# Patient Record
Sex: Male | Born: 2000 | Race: White | Hispanic: No | Marital: Single | Smoking: Former smoker
Health system: Southern US, Community
[De-identification: ages and names within clinical notes are randomized; demographics above are authoritative.]

## PROBLEM LIST (undated history)

## (undated) DIAGNOSIS — Z789 Other specified health status: Secondary | ICD-10-CM

## (undated) HISTORY — PX: SHOULDER SURGERY: SHX246

## (undated) HISTORY — DX: Other specified health status: Z78.9

---
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
## 2019-01-19 DIAGNOSIS — M25611 Stiffness of right shoulder, not elsewhere classified: Secondary | ICD-10-CM | POA: Insufficient documentation

## 2019-03-14 IMAGING — XA DG FLUORO GUIDE NDL PLC/BX
1 series · 1 of 1 positions shown · IV contrast (multihance)
Comparison: none

CLINICAL DATA: Chronic right shoulder pain.

EXAM:
RIGHT SHOULDER INJECTION UNDER FLUOROSCOPY
TECHNIQUE: An appropriate skin entrance site was determined. The site was
marked, prepped with Betadine, draped in the usual sterile fashion,
and infiltrated locally with buffered Lidocaine. A 22 gauge spinal
needle was advanced to the superomedial margin of the humeral head
under intermittent fluoroscopy. 1 mL of 1% lidocaine injected
easily. A mixture of 0.05 mL of MultiHance, 5 mL of 1% lidocaine,
and 15 mL of Isovue-M 200 was then used to opacify the right
shoulder capsule. 12 mL of this mixture were injected. No immediate
complication.
FLUOROSCOPY TIME:  Fluoroscopy Time:  3 seconds
Radiation Exposure Index (if provided by the fluoroscopic device):
14.31 microGray*m^2
Number of Acquired Spot Images: 0

[Series 1: ortho standard · 1 of 1 slices shown]
[im 1/1]
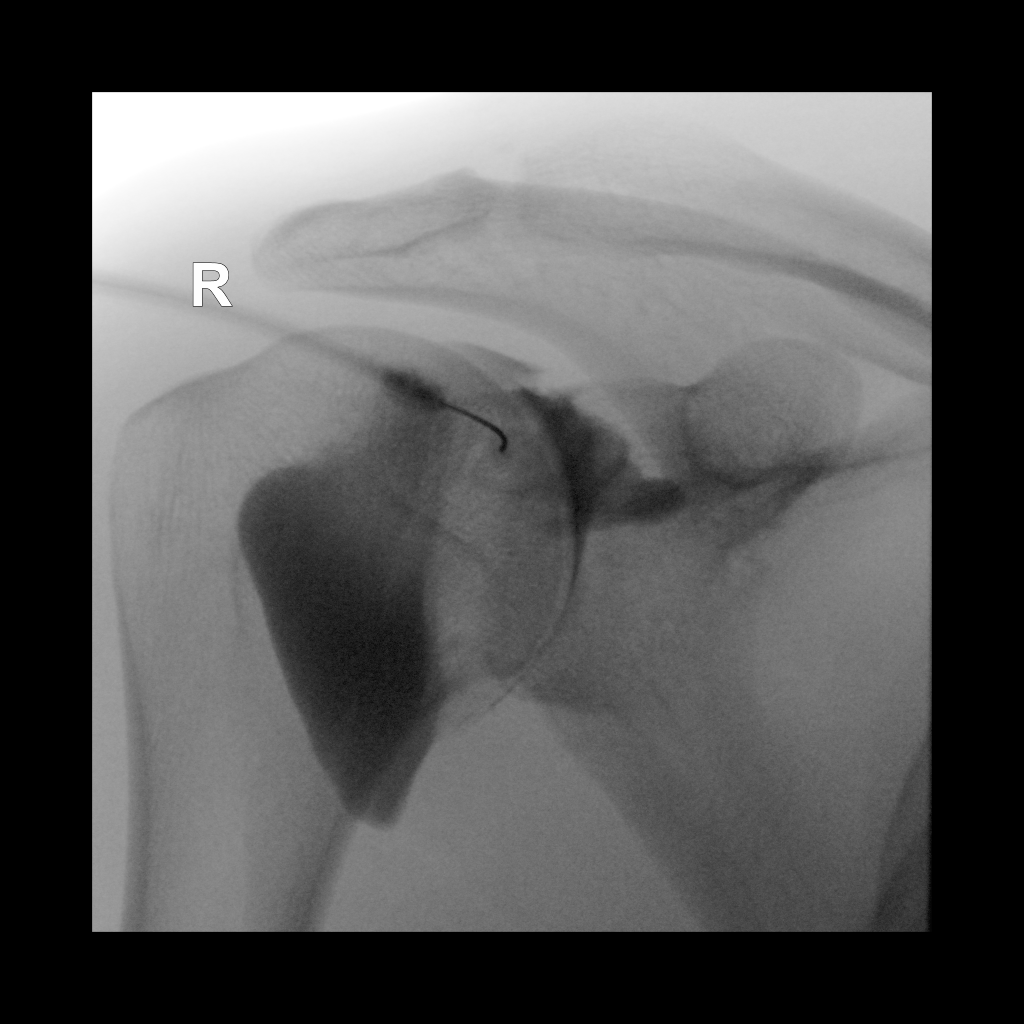

[1 of 1 positions shown; findings below may reference images not displayed]

IMPRESSION: Technically successful right shoulder injection for MRI.

## 2020-04-26 IMAGING — CR RIGHT ELBOW - COMPLETE 3+ VIEW
4 series · 4 of 4 positions shown · non-contrast
Comparison: None.

CLINICAL DATA: Fall from truck bed with elbow pain, initial
encounter

EXAM:
RIGHT ELBOW - COMPLETE 3+ VIEW

[elbow ap]
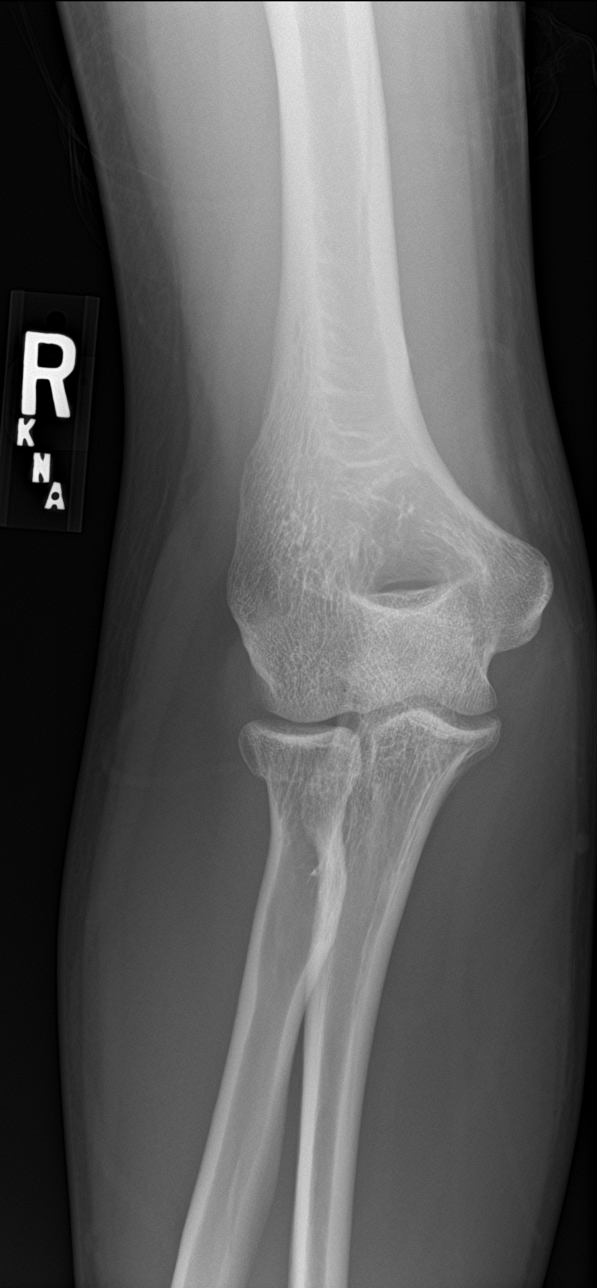

[elbow obl (1 of 2)]
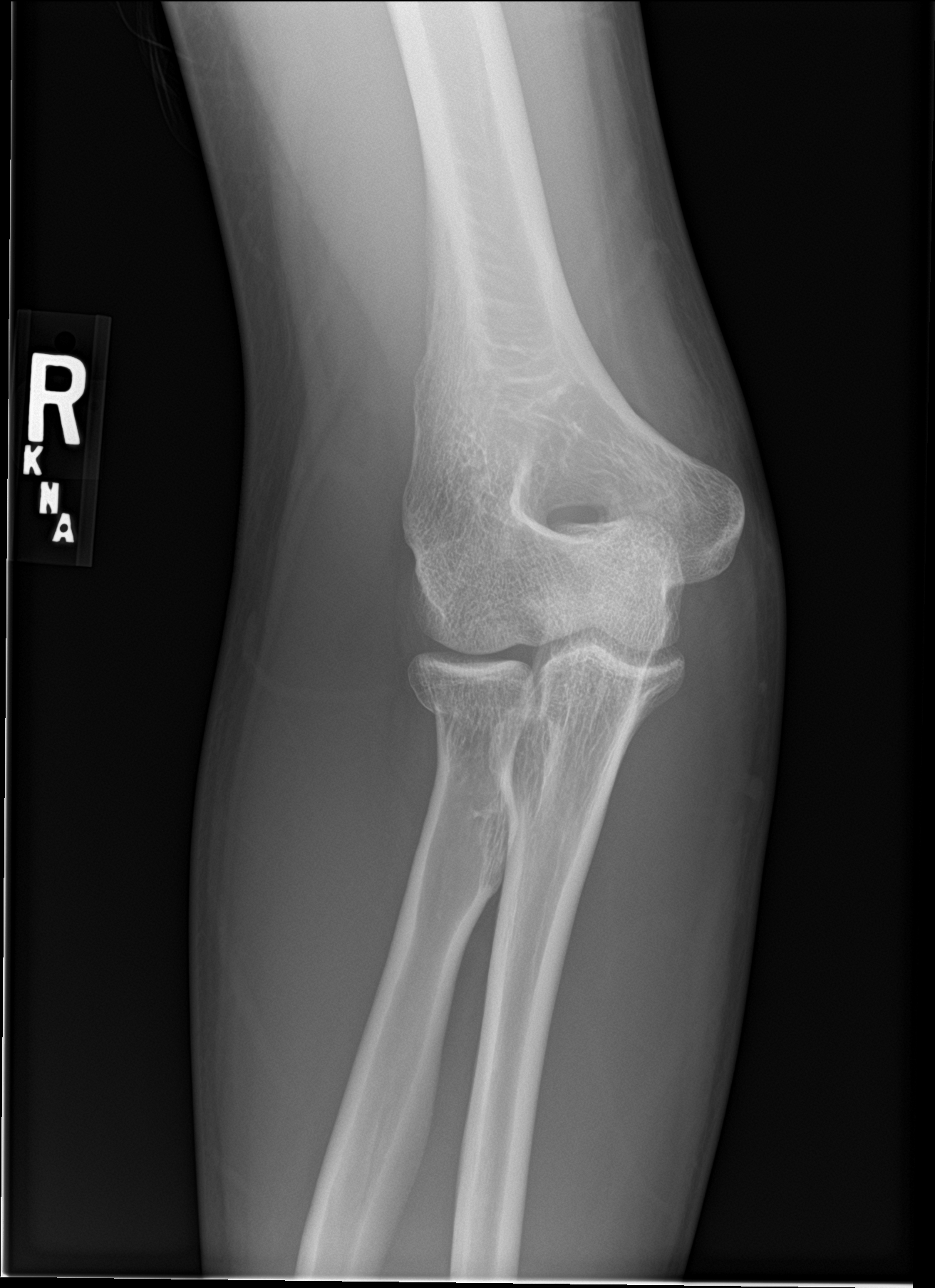

[elbow obl (2 of 2)]
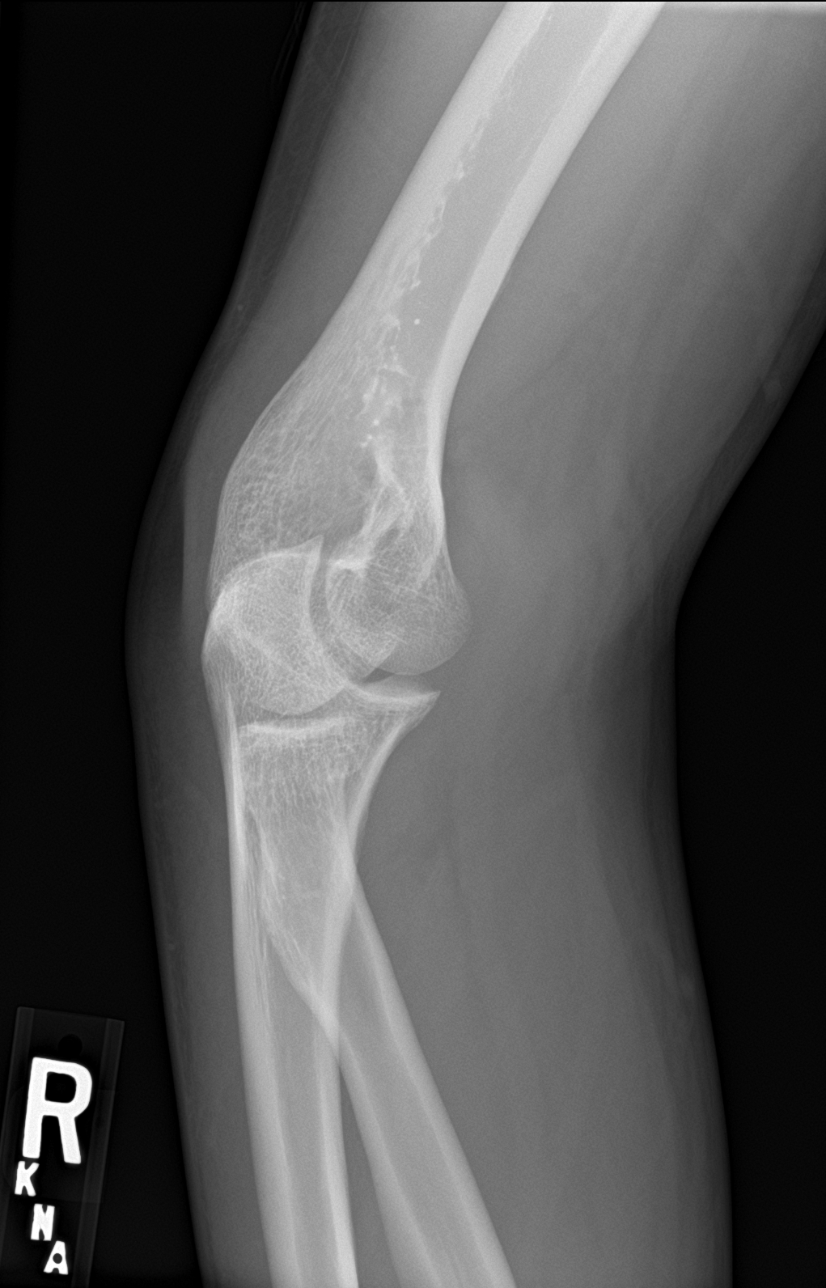

[elbow lat]
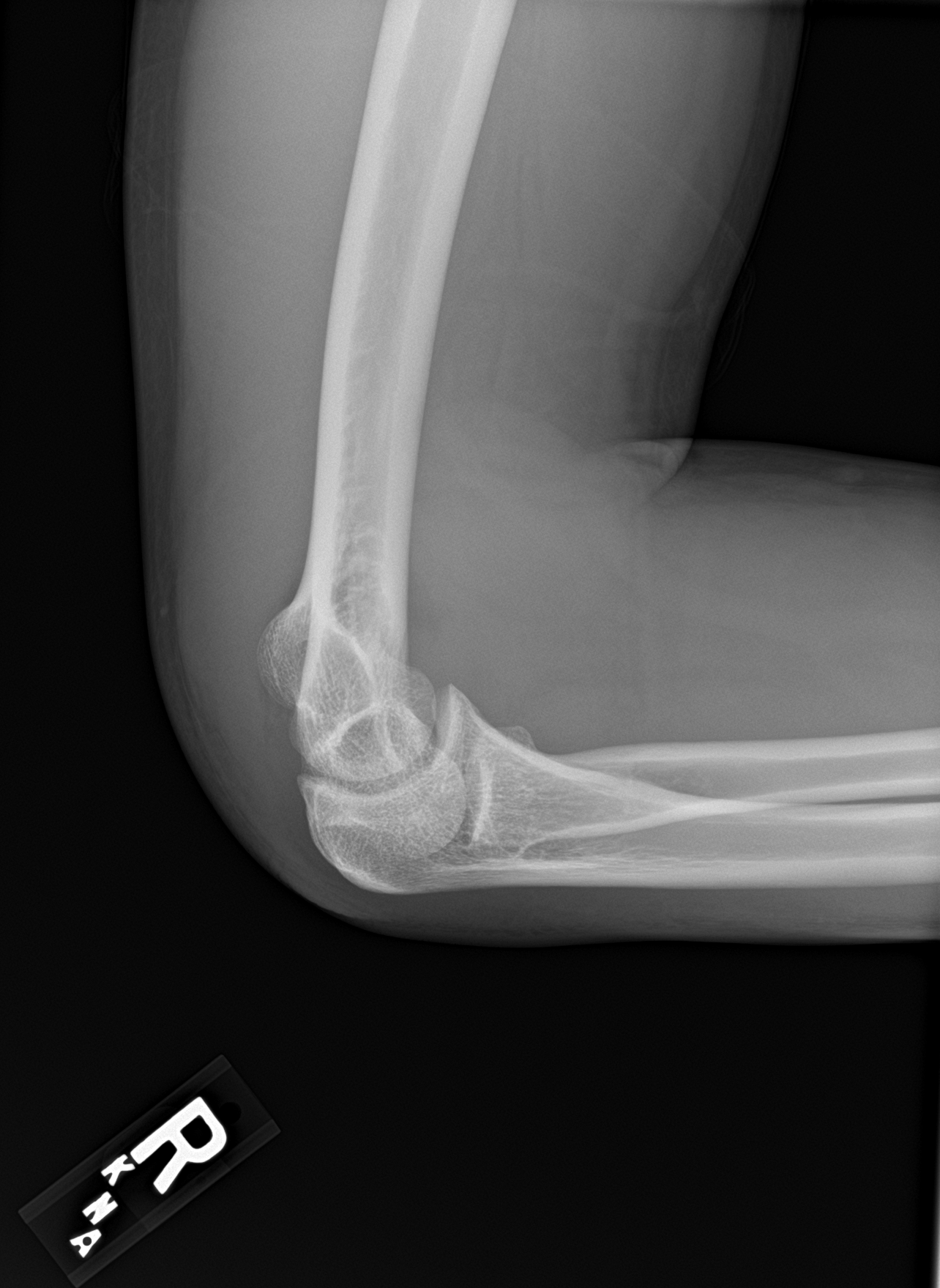

[4 of 4 positions shown; findings below may reference images not displayed]

FINDINGS: Mildly impacted radial head fracture is noted at the junction of the
head and radial neck. Associated joint effusion is seen. No bony is
noted.
IMPRESSION: Mildly impacted proximal radial fracture.

## 2020-07-31 DIAGNOSIS — M25511 Pain in right shoulder: Secondary | ICD-10-CM | POA: Diagnosis not present

## 2020-08-13 DIAGNOSIS — Z6822 Body mass index (BMI) 22.0-22.9, adult: Secondary | ICD-10-CM | POA: Diagnosis not present

## 2020-08-13 DIAGNOSIS — R110 Nausea: Secondary | ICD-10-CM | POA: Diagnosis not present

## 2020-08-13 DIAGNOSIS — J069 Acute upper respiratory infection, unspecified: Secondary | ICD-10-CM | POA: Diagnosis not present

## 2020-08-14 DIAGNOSIS — M25511 Pain in right shoulder: Secondary | ICD-10-CM | POA: Diagnosis not present

## 2020-09-03 DIAGNOSIS — M25511 Pain in right shoulder: Secondary | ICD-10-CM | POA: Diagnosis not present

## 2020-09-24 DIAGNOSIS — M25511 Pain in right shoulder: Secondary | ICD-10-CM | POA: Diagnosis not present

## 2020-10-09 DIAGNOSIS — M25511 Pain in right shoulder: Secondary | ICD-10-CM | POA: Diagnosis not present

## 2020-10-24 DIAGNOSIS — M25511 Pain in right shoulder: Secondary | ICD-10-CM | POA: Diagnosis not present

## 2020-10-31 DIAGNOSIS — F509 Eating disorder, unspecified: Secondary | ICD-10-CM | POA: Diagnosis not present

## 2020-11-05 DIAGNOSIS — M25511 Pain in right shoulder: Secondary | ICD-10-CM | POA: Diagnosis not present

## 2020-11-06 DIAGNOSIS — R945 Abnormal results of liver function studies: Secondary | ICD-10-CM | POA: Diagnosis not present

## 2020-12-04 DIAGNOSIS — M25511 Pain in right shoulder: Secondary | ICD-10-CM | POA: Diagnosis not present

## 2021-01-01 DIAGNOSIS — M25511 Pain in right shoulder: Secondary | ICD-10-CM | POA: Diagnosis not present

## 2021-01-15 DIAGNOSIS — M25511 Pain in right shoulder: Secondary | ICD-10-CM | POA: Diagnosis not present

## 2021-05-14 DIAGNOSIS — M25511 Pain in right shoulder: Secondary | ICD-10-CM | POA: Diagnosis not present

## 2021-06-02 DIAGNOSIS — M25511 Pain in right shoulder: Secondary | ICD-10-CM | POA: Diagnosis not present

## 2022-02-09 DIAGNOSIS — M7918 Myalgia, other site: Secondary | ICD-10-CM | POA: Diagnosis not present

## 2022-02-09 DIAGNOSIS — M9905 Segmental and somatic dysfunction of pelvic region: Secondary | ICD-10-CM | POA: Diagnosis not present

## 2022-02-09 DIAGNOSIS — M9904 Segmental and somatic dysfunction of sacral region: Secondary | ICD-10-CM | POA: Diagnosis not present

## 2022-02-09 DIAGNOSIS — M9903 Segmental and somatic dysfunction of lumbar region: Secondary | ICD-10-CM | POA: Diagnosis not present

## 2022-03-02 DIAGNOSIS — M9904 Segmental and somatic dysfunction of sacral region: Secondary | ICD-10-CM | POA: Diagnosis not present

## 2022-03-02 DIAGNOSIS — M7918 Myalgia, other site: Secondary | ICD-10-CM | POA: Diagnosis not present

## 2022-03-02 DIAGNOSIS — M9903 Segmental and somatic dysfunction of lumbar region: Secondary | ICD-10-CM | POA: Diagnosis not present

## 2022-03-02 DIAGNOSIS — M9905 Segmental and somatic dysfunction of pelvic region: Secondary | ICD-10-CM | POA: Diagnosis not present

## 2022-03-23 DIAGNOSIS — M7918 Myalgia, other site: Secondary | ICD-10-CM | POA: Diagnosis not present

## 2022-03-23 DIAGNOSIS — M9904 Segmental and somatic dysfunction of sacral region: Secondary | ICD-10-CM | POA: Diagnosis not present

## 2022-03-23 DIAGNOSIS — M9905 Segmental and somatic dysfunction of pelvic region: Secondary | ICD-10-CM | POA: Diagnosis not present

## 2022-03-23 DIAGNOSIS — M9903 Segmental and somatic dysfunction of lumbar region: Secondary | ICD-10-CM | POA: Diagnosis not present

## 2022-04-17 DIAGNOSIS — M9904 Segmental and somatic dysfunction of sacral region: Secondary | ICD-10-CM | POA: Diagnosis not present

## 2022-04-17 DIAGNOSIS — M9903 Segmental and somatic dysfunction of lumbar region: Secondary | ICD-10-CM | POA: Diagnosis not present

## 2022-04-17 DIAGNOSIS — M9905 Segmental and somatic dysfunction of pelvic region: Secondary | ICD-10-CM | POA: Diagnosis not present

## 2022-04-17 DIAGNOSIS — M7918 Myalgia, other site: Secondary | ICD-10-CM | POA: Diagnosis not present

## 2022-05-12 DIAGNOSIS — M9903 Segmental and somatic dysfunction of lumbar region: Secondary | ICD-10-CM | POA: Diagnosis not present

## 2022-05-12 DIAGNOSIS — M9905 Segmental and somatic dysfunction of pelvic region: Secondary | ICD-10-CM | POA: Diagnosis not present

## 2022-05-12 DIAGNOSIS — M9904 Segmental and somatic dysfunction of sacral region: Secondary | ICD-10-CM | POA: Diagnosis not present

## 2022-05-12 DIAGNOSIS — M7918 Myalgia, other site: Secondary | ICD-10-CM | POA: Diagnosis not present

## 2022-05-26 ENCOUNTER — Ambulatory Visit: Payer: BC Managed Care – PPO | Admitting: Podiatry

## 2022-05-26 ENCOUNTER — Ambulatory Visit (INDEPENDENT_AMBULATORY_CARE_PROVIDER_SITE_OTHER): Payer: BC Managed Care – PPO

## 2022-05-26 ENCOUNTER — Encounter: Payer: Self-pay | Admitting: Podiatry

## 2022-05-26 DIAGNOSIS — M76811 Anterior tibial syndrome, right leg: Secondary | ICD-10-CM | POA: Diagnosis not present

## 2022-05-26 DIAGNOSIS — M7751 Other enthesopathy of right foot: Secondary | ICD-10-CM | POA: Diagnosis not present

## 2022-05-26 DIAGNOSIS — M778 Other enthesopathies, not elsewhere classified: Secondary | ICD-10-CM

## 2022-05-26 MED ORDER — METHYLPREDNISOLONE 4 MG PO TBPK: ORAL_TABLET | ORAL | 0 refills | Status: DC

## 2022-05-31 NOTE — Progress Notes (Signed)
  Subjective:  Patient ID: Nathaniel Good, male    DOB: 02-Nov-2000,  MRN: [id number] HPI Chief Complaint  Patient presents with   Foot Pain    Dorsal midfoot/anterior ankle right - ran an ultra marathon (40 miles) on Oct 28th, noticed next day foot didn't feel right, now having trouble lifting foot, very painful at times   New Patient (Initial Visit)    21 y.o. male presents with the above complaint.   ROS: Denies fever chills nausea vomiting muscle aches pains calf pain back pain chest pain shortness of breath.  No past medical history on file. Past Surgical History:  Procedure Laterality Date   FEMUR FRACTURE SURGERY Left 2003   SHOULDER SURGERY Right     Current Outpatient Medications:    methylPREDNISolone (MEDROL DOSEPAK) 4 MG TBPK tablet, 6 day dose pack - take as directed, Disp: 21 tablet, Rfl: 0  No Known Allergies Review of Systems Objective:  There were no vitals filed for this visit.  General: Well developed, nourished, in no acute distress, alert and oriented x3   Dermatological: Skin is warm, dry and supple bilateral. Nails x 10 are well maintained; remaining integument appears unremarkable at this time. There are no open sores, no preulcerative lesions, no rash or signs of infection present.  Vascular: Dorsalis Pedis artery and Posterior Tibial artery pedal pulses are 2/4 bilateral with immedate capillary fill time. Pedal hair growth present. No varicosities and no lower extremity edema present bilateral.   Neruologic: Grossly intact via light touch bilateral. Vibratory intact via tuning fork bilateral. Protective threshold with Semmes Wienstein monofilament intact to all pedal sites bilateral. Patellar and Achilles deep tendon reflexes 2+ bilateral. No Babinski or clonus noted bilateral.   Musculoskeletal: No gross boney pedal deformities bilateral. No pain, crepitus, or limitation noted with foot and ankle range of motion bilateral. Muscular strength 5/5 in all  groups tested bilateral.  Tenderness on palpation of the anterior ankle joint as well as on palpation of the tibialis anterior tendon as it courses anterior medial aspect of the right foot near its insertion site.  Gait: Unassisted, Nonantalgic.    Radiographs:  Radiographs taken today do not demonstrate any type of osseous abnormalities of the some soft tissue swelling of the anterior ankle.  No fractures identified at this time.  Assessment & Plan:   Assessment: Probable ankle joint capsulitis tibialis anterior tendinitis  Plan: Discussed icing with him compression putting back on his runs discussed making sure his shoes were good and stable start him on methylprednisolone I like to follow-up with him in 1 month     Maekayla Giorgio T. [place], [place]

## 2022-06-08 DIAGNOSIS — M9903 Segmental and somatic dysfunction of lumbar region: Secondary | ICD-10-CM | POA: Diagnosis not present

## 2022-06-08 DIAGNOSIS — M9905 Segmental and somatic dysfunction of pelvic region: Secondary | ICD-10-CM | POA: Diagnosis not present

## 2022-06-08 DIAGNOSIS — M9904 Segmental and somatic dysfunction of sacral region: Secondary | ICD-10-CM | POA: Diagnosis not present

## 2022-06-08 DIAGNOSIS — M7918 Myalgia, other site: Secondary | ICD-10-CM | POA: Diagnosis not present

## 2022-06-17 ENCOUNTER — Encounter: Payer: Self-pay | Admitting: Family Medicine

## 2022-06-17 ENCOUNTER — Ambulatory Visit: Payer: BC Managed Care – PPO | Admitting: Family Medicine

## 2022-06-17 VITALS — BP 121/82 | HR 79 | Temp 98.7°F | Ht 73.0 in | Wt 171.4 lb

## 2022-06-17 DIAGNOSIS — F411 Generalized anxiety disorder: Secondary | ICD-10-CM | POA: Diagnosis not present

## 2022-06-17 MED ORDER — CITALOPRAM HYDROBROMIDE 20 MG PO TABS: ORAL_TABLET | ORAL | 3 refills | Status: DC

## 2022-06-17 NOTE — Patient Instructions (Addendum)
Please consider counseling. Contact [phone] to schedule an appointment or inquire about cost/insurance coverage.  [facility] located at [address], [place], [place].  Phone number = [phone].  Dr. Onoriode Edeh - Adult Psychiatry.    [facility] located at [address], [place], [place]. Phone number = [phone].   [facility] located at [address], [place], [place].  Phone number = [phone].   [facility] located at [address], [place], [place].  Phone number = [phone].  Aim to do some physical exertion for 150 minutes per week. This is typically divided into 5 days per week, 30 minutes per day. The activity should be enough to get your heart rate up. Anything is better than nothing if you have time constraints.  Let us know if you need anything.  

## 2022-06-17 NOTE — Progress Notes (Signed)
Chief Complaint  Patient presents with   New Patient (Initial Visit)   Anxiety    For 6 months        New Patient Visit SUBJECTIVE: HPI: Nathaniel Good is an 21 y.o.male who is being seen for establishing care.  Over past 5 mo, has been struggling w anxiety. He has anxiety, racing thoughts, feelings of panic, guilt, fluctuating energy levels, irritable. No depressive s/s's. No famhx of mental health issues. He is very active with running and wt lifting. No trauma or obvious trigger for this. No SI or HI. He is not following with a Veterinary surgeon.   Past Medical History:  Diagnosis Date   No known health problems    Past Surgical History:  Procedure Laterality Date   FEMUR FRACTURE SURGERY Left 2003   SHOULDER SURGERY Right 2019   x3; 11/2017, 12/2018, 09/2019   Family History  Problem Relation Age of Onset   Cancer Neg Hx    Heart disease Neg Hx    No Known Allergies   Current Outpatient Medications:    citalopram (CELEXA) 20 MG tablet, Take 1/2 tab daily for 2 weeks and then 1 tab daily., Disp: 30 tablet, Rfl: 3  OBJECTIVE: BP 121/82 (BP Location: Left Arm, Patient Position: Sitting, Cuff Size: Normal)   Pulse 79   Temp 98.7 F (37.1 C) (Oral)   Ht 6\' 1"  (1.854 m)   Wt 171 lb 6 oz (77.7 kg)   SpO2 96%   BMI 22.61 kg/m  General:  well developed, well nourished, in no apparent distress Skin:  no significant moles, warts, or growths on exposed skin Lungs:  clear to auscultation, breath sounds equal bilaterally, no respiratory distress Cardio:  regular rate and rhythm, no LE edema or bruits Musculoskeletal:  symmetrical muscle groups noted without atrophy or deformity Neuro:  gait normal, patellar DTRs equal and symmetric bilaterally, no clonus, no cerebellar signs Psych: well oriented with normal range of affect and appropriate judgment/insight  ASSESSMENT/PLAN: GAD (generalized anxiety disorder) - Plan: citalopram (CELEXA) 20 MG tablet  Chronic, unstable.  Start Celexa  10 mg daily for 2 weeks and then increase to 20 mg daily.  Counseling information provided.  Counseled on exercise which it sounds like he is doing a good job of doing.  Follow-up in 5 weeks for physical.  He would also like to have some hormone levels checked like testosterone and thyroid.  Will add this to his regular blood work panel. The patient voiced understanding and agreement to the plan.   [place], DO 06/17/22  4:22 PM

## 2022-06-22 DIAGNOSIS — M7918 Myalgia, other site: Secondary | ICD-10-CM | POA: Diagnosis not present

## 2022-06-22 DIAGNOSIS — M9903 Segmental and somatic dysfunction of lumbar region: Secondary | ICD-10-CM | POA: Diagnosis not present

## 2022-06-22 DIAGNOSIS — M9904 Segmental and somatic dysfunction of sacral region: Secondary | ICD-10-CM | POA: Diagnosis not present

## 2022-06-22 DIAGNOSIS — M9905 Segmental and somatic dysfunction of pelvic region: Secondary | ICD-10-CM | POA: Diagnosis not present

## 2022-06-23 ENCOUNTER — Ambulatory Visit: Payer: BC Managed Care – PPO | Admitting: Podiatry

## 2022-06-23 ENCOUNTER — Encounter: Payer: Self-pay | Admitting: Podiatry

## 2022-06-23 DIAGNOSIS — M76811 Anterior tibial syndrome, right leg: Secondary | ICD-10-CM

## 2022-06-23 DIAGNOSIS — M7751 Other enthesopathy of right foot: Secondary | ICD-10-CM | POA: Diagnosis not present

## 2022-06-23 DIAGNOSIS — M66869 Spontaneous rupture of other tendons, unspecified lower leg: Secondary | ICD-10-CM

## 2022-06-24 NOTE — Progress Notes (Signed)
Nathaniel Good presents today states that he was doing really well while he was on the steroids and then once he came off of those the foot started to hurt again he states that he tried to run but was still experiencing some shinsplints and significant pain overlying the anterior talofibular ligament as he points to this area.  Objective: Vital signs are stable he is alert and oriented x 3.  Pulses are palpable.  He has pain on palpation of the anterior talofibular ligament with some lateral ankle instability and tenderness on palpation of the tibialis anterior muscle belly of the and the tendon itself as it courses anterior to the ankle.  Assessment probable lateral ankle instability possible tear of the anterior talofibular ligament possible tear of the tibialis anterior tendon.  Plan: Requesting MRI for surgical consideration and differential diagnoses.  MRI be of the right lower extremity rear foot.  No contrast.

## 2022-06-30 DIAGNOSIS — F4322 Adjustment disorder with anxiety: Secondary | ICD-10-CM | POA: Diagnosis not present

## 2022-07-10 ENCOUNTER — Other Ambulatory Visit: Payer: Self-pay | Admitting: Family Medicine

## 2022-07-10 ENCOUNTER — Encounter: Payer: Self-pay | Admitting: Podiatry

## 2022-07-10 DIAGNOSIS — F411 Generalized anxiety disorder: Secondary | ICD-10-CM

## 2022-07-13 ENCOUNTER — Ambulatory Visit
Admission: RE | Admit: 2022-07-13 | Discharge: 2022-07-13 | Disposition: A | Discharge status: Home / Self Care | Payer: BC Managed Care – PPO | Source: Ambulatory Visit | Attending: Podiatry | Admitting: Podiatry

## 2022-07-13 DIAGNOSIS — M7918 Myalgia, other site: Secondary | ICD-10-CM | POA: Diagnosis not present

## 2022-07-13 DIAGNOSIS — M9903 Segmental and somatic dysfunction of lumbar region: Secondary | ICD-10-CM | POA: Diagnosis not present

## 2022-07-13 DIAGNOSIS — M6589 Other synovitis and tenosynovitis, multiple sites: Secondary | ICD-10-CM | POA: Diagnosis not present

## 2022-07-13 DIAGNOSIS — M9905 Segmental and somatic dysfunction of pelvic region: Secondary | ICD-10-CM | POA: Diagnosis not present

## 2022-07-13 DIAGNOSIS — M9904 Segmental and somatic dysfunction of sacral region: Secondary | ICD-10-CM | POA: Diagnosis not present

## 2022-07-13 DIAGNOSIS — M25571 Pain in right ankle and joints of right foot: Secondary | ICD-10-CM | POA: Diagnosis not present

## 2022-07-13 DIAGNOSIS — M66869 Spontaneous rupture of other tendons, unspecified lower leg: Secondary | ICD-10-CM

## 2022-07-15 MED ORDER — METHYLPREDNISOLONE 4 MG PO TBPK: ORAL_TABLET | ORAL | 0 refills | Status: DC

## 2022-07-15 NOTE — Telephone Encounter (Signed)
-----   Message from Garrel Ridgel, [place] sent at 07/15/2022 10:21 AM EST ----- He needs to be in a short cam boot for 2 to 3 weeks.  No running no exercise.  And we can send another prescription for methylprednisolone to his pharmacy.

## 2022-07-16 NOTE — Telephone Encounter (Signed)
Called and left message for call back for updated information from physician

## 2022-07-17 ENCOUNTER — Encounter: Payer: Self-pay | Admitting: Family Medicine

## 2022-07-17 ENCOUNTER — Ambulatory Visit: Payer: BC Managed Care – PPO | Admitting: Family Medicine

## 2022-07-17 VITALS — BP 114/54 | HR 57 | Temp 97.9°F | Resp 16 | Ht 72.0 in | Wt 173.0 lb

## 2022-07-17 DIAGNOSIS — F411 Generalized anxiety disorder: Secondary | ICD-10-CM

## 2022-07-17 MED ORDER — HYDROXYZINE HCL 10 MG PO TABS: 10.0000 mg | ORAL_TABLET | Freq: Three times a day (TID) | ORAL | 1 refills | Status: DC | PRN

## 2022-07-17 NOTE — Patient Instructions (Addendum)
Adding low-dose hydroxyzine for as needed use (anxiety/panic attacks). Side effects discussed - please review attached handout. Take first dose at home so you will know if it makes you drowsy or not.  Celexa: Taking the medicine as directed and not missing any doses is one of the best things you can do to treat your anxiety/depression.  Here are some things to keep in mind: Side effects (stomach upset, some increased anxiety) may happen before you notice a benefit.  These side effects typically go away over time. Changes to your dose of medicine or a change in medication all together is sometimes necessary Many people will notice an improvement within two weeks but the full effect of the medication can take up to 4-6 weeks Stopping the medication when you start feeling better often results in a return of symptoms. Most people need to be on medication at least 6-12 months If you start having thoughts of hurting yourself or others after starting this medicine, please call me immediately.

## 2022-07-17 NOTE — Progress Notes (Signed)
Acute Office Visit  Subjective:     Patient ID: Nathaniel Good, male    DOB: [birth year], 22 y.o.   MRN: [id number]  Chief Complaint  Patient presents with   Anxiety     Patient is in today for anxiety.   Reports he has been struggling with anxiety for awhile now, but cannot pinpoint any specific triggers. States he discussed with PCP last month and was started on Celexa. He took 10 mg for 2 weeks, and just recently increased to 20 mg daily. He has not had any side effects, but said he hasn't seen much difference yet. He did admit to missing a day or two so he could have some alcohol on his birthday earlier this week. He has started therapy/counseling, but has only had one session so far. He denies any family history. No SI/HI.      07/17/2022    8:29 AM 06/17/2022    3:23 PM  PHQ9 SCORE ONLY  PHQ-9 Total Score 11 2      07/17/2022    8:30 AM  GAD 7 : Generalized Anxiety Score  Nervous, Anxious, on Edge 2  Control/stop worrying 2  Worry too much - different things 2  Trouble relaxing 2  Restless 1  Easily annoyed or irritable 2  Afraid - awful might happen 2  Total GAD 7 Score 13  Anxiety Difficulty Somewhat difficult       ROS All review of systems negative except what is listed in the HPI      Objective:    BP (!) 114/54   Pulse (!) 57   Temp 97.9 F (36.6 C)   Resp 16   Ht 6' (1.829 m)   Wt 173 lb (78.5 kg)   SpO2 99%   BMI 23.46 kg/m    Physical Exam Vitals reviewed.  Constitutional:      Appearance: Normal appearance.  Cardiovascular:     Rate and Rhythm: Normal rate and regular rhythm.     Pulses: Normal pulses.     Heart sounds: Normal heart sounds.  Pulmonary:     Effort: Pulmonary effort is normal.     Breath sounds: Normal breath sounds.  Skin:    General: Skin is warm and dry.  Neurological:     Mental Status: He is alert and oriented to person, place, and time.  Psychiatric:        Mood and Affect: Mood normal.        Behavior:  Behavior normal.        Thought Content: Thought content normal.        Judgment: Judgment normal.     No results found for any visits on 07/17/22.      Assessment & Plan:   Problem List Items Addressed This Visit   None Visit Diagnoses     GAD (generalized anxiety disorder)    -  Primary Education provided regarding timeline of effectiveness with Celexa.  No SI/HI Add hydroxyzine Continue counseling Keep 07/28/22 appt with PCP to follow-up  Pt instructions- Adding low-dose hydroxyzine for as needed use (anxiety/panic attacks). Side effects discussed - please review attached handout. Take first dose at home so you will know if it makes you drowsy or not.  Celexa: Taking the medicine as directed and not missing any doses is one of the best things you can do to treat your anxiety/depression.  Here are some things to keep in mind: Side effects (stomach upset, some increased anxiety) may  happen before you notice a benefit.  These side effects typically go away over time. Changes to your dose of medicine or a change in medication all together is sometimes necessary Many people will notice an improvement within two weeks but the full effect of the medication can take up to 4-6 weeks Stopping the medication when you start feeling better often results in a return of symptoms. Most people need to be on medication at least 6-12 months If you start having thoughts of hurting yourself or others after starting this medicine, please call me immediately.        Relevant Medications   hydrOXYzine (ATARAX) 10 MG tablet       Meds ordered this encounter  Medications   hydrOXYzine (ATARAX) 10 MG tablet    Sig: Take 1 tablet (10 mg total) by mouth 3 (three) times daily as needed.    Dispense:  30 tablet    Refill:  1    Order Specific Question:   Supervising Provider    Answer:   Mosie Lukes [[id number]]    Return for - keep upcoming PCP appointment. Follow-up sooner if needed.  Terrilyn Saver, NP

## 2022-07-23 DIAGNOSIS — F4322 Adjustment disorder with anxiety: Secondary | ICD-10-CM | POA: Diagnosis not present

## 2022-07-28 ENCOUNTER — Ambulatory Visit (INDEPENDENT_AMBULATORY_CARE_PROVIDER_SITE_OTHER): Payer: BC Managed Care – PPO | Admitting: Family Medicine

## 2022-07-28 ENCOUNTER — Encounter: Payer: Self-pay | Admitting: Family Medicine

## 2022-07-28 VITALS — BP 120/71 | HR 61 | Temp 97.8°F | Ht 72.0 in | Wt 173.5 lb

## 2022-07-28 DIAGNOSIS — R5383 Other fatigue: Secondary | ICD-10-CM | POA: Diagnosis not present

## 2022-07-28 DIAGNOSIS — Z0000 Encounter for general adult medical examination without abnormal findings: Secondary | ICD-10-CM | POA: Diagnosis not present

## 2022-07-28 DIAGNOSIS — Z1159 Encounter for screening for other viral diseases: Secondary | ICD-10-CM | POA: Diagnosis not present

## 2022-07-28 DIAGNOSIS — Z114 Encounter for screening for human immunodeficiency virus [HIV]: Secondary | ICD-10-CM | POA: Diagnosis not present

## 2022-07-28 DIAGNOSIS — Z23 Encounter for immunization: Secondary | ICD-10-CM | POA: Diagnosis not present

## 2022-07-28 DIAGNOSIS — F411 Generalized anxiety disorder: Secondary | ICD-10-CM

## 2022-07-28 LAB — LIPID PANEL
Cholesterol: 142 mg/dL (ref 0–200)
HDL: 65.3 mg/dL (ref >39.00)
LDL Cholesterol: 67 mg/dL (ref 0–99)
NonHDL: 76.32
Total CHOL/HDL Ratio: 2
Triglycerides: 48 mg/dL (ref 0.0–149.0)
VLDL: 9.6 mg/dL (ref 0.0–40.0)

## 2022-07-28 LAB — CBC
HCT: 45.1 % (ref 39.0–52.0)
Hemoglobin: 15.3 g/dL (ref 13.0–17.0)
MCHC: 33.9 g/dL (ref 30.0–36.0)
MCV: 90.7 fl (ref 78.0–100.0)
Platelets: 274 10*3/uL (ref 150.0–400.0)
RBC: 4.97 Mil/uL (ref 4.22–5.81)
RDW: 13 % (ref 11.5–15.5)
WBC: 4.5 10*3/uL (ref 4.0–10.5)

## 2022-07-28 LAB — COMPREHENSIVE METABOLIC PANEL
ALT: 19 U/L (ref 0–53)
AST: 25 U/L (ref 0–37)
Albumin: 4.3 g/dL (ref 3.5–5.2)
Alkaline Phosphatase: 67 U/L (ref 39–117)
BUN: 21 mg/dL (ref 6–23)
CO2: 30 mEq/L (ref 19–32)
Calcium: 9 mg/dL (ref 8.4–10.5)
Chloride: 102 mEq/L (ref 96–112)
Creatinine, Ser: 1.08 mg/dL (ref 0.40–1.50)
GFR: 97.05 mL/min (ref >60.00)
Glucose, Bld: 92 mg/dL (ref 70–99)
Potassium: 4.5 mEq/L (ref 3.5–5.1)
Sodium: 139 mEq/L (ref 135–145)
Total Bilirubin: 0.6 mg/dL (ref 0.2–1.2)
Total Protein: 6.8 g/dL (ref 6.0–8.3)

## 2022-07-28 LAB — TESTOSTERONE: Testosterone: 503.16 ng/dL (ref 300.00–890.00)

## 2022-07-28 LAB — TSH: TSH: 1.17 u[IU]/mL (ref 0.35–5.50)

## 2022-07-28 MED ORDER — CITALOPRAM HYDROBROMIDE 20 MG PO TABS: 20.0000 mg | ORAL_TABLET | Freq: Every day | ORAL | 2 refills | Status: DC

## 2022-07-28 NOTE — Patient Instructions (Addendum)
Give [place] 2-3 business days to get the results of your labs back.   Keep the diet clean and stay active.  Please get me a copy of your advanced directive form at your convenience.   Do monthly self testicular checks in the shower. You are feeling for lumps/bumps that don't belong. If you feel anything like this, let me know!  Sleep Hygiene Tips: Do not watch TV or look at screens within 1 hour of going to bed. If you do, make sure there is a blue light filter (nighttime mode) involved. Try to go to bed around the same time every night. Wake up at the same time within 1 hour of regular time. Ex: If you wake up at 7 AM for work, do not sleep past 8 AM on days that you don't work. Do not drink alcohol before bedtime. Do not consume caffeine-containing beverages after noon or within 9 hours of intended bedtime. Get regular exercise/physical activity in your life, but not within 2 hours of planned bedtime. Do not take naps.  Do not eat within 2 hours of planned bedtime. Melatonin, 3-5 mg 30-60 minutes before planned bedtime may be helpful.  The bed should be for sleep or sex only. If after 20-30 minutes you are unable to fall asleep, get up and do something relaxing. Do this until you feel ready to go to sleep again.   Let us know if you need anything.

## 2022-07-28 NOTE — Addendum Note (Signed)
Addended by: Sharon Seller B on: 07/28/2022 08:58 AM   Modules accepted: Orders

## 2022-07-28 NOTE — Progress Notes (Signed)
Chief Complaint  Patient presents with   Annual Exam    Anxiety has improved    Well Male Nathaniel Good is here for a complete physical.   His last physical was >1 year ago.  Current diet: in general, a "healthy" diet.   Current exercise: running, lifting Weight trend: stable Fatigue out of ordinary? Some. Seat belt? Yes.   Advanced directive? No  Health maintenance Tetanus- Due HIV- No Hep C- No  Past Medical History:  Diagnosis Date   No known health problems      Past Surgical History:  Procedure Laterality Date   FEMUR FRACTURE SURGERY Left 2003   SHOULDER SURGERY Right 2019   x3; 11/2017, 12/2018, 09/2019    Medications  Current Outpatient Medications on File Prior to Visit  Medication Sig Dispense Refill   citalopram (CELEXA) 20 MG tablet TAKE 1/2 TAB DAILY FOR 2 WEEKS AND THEN 1 TAB DAILY. 90 tablet 2   hydrOXYzine (ATARAX) 10 MG tablet Take 1 tablet (10 mg total) by mouth 3 (three) times daily as needed. 30 tablet 1   Allergies No Known Allergies  Family History Family History  Problem Relation Age of Onset   Cancer Neg Hx    Heart disease Neg Hx     Review of Systems: Constitutional: no fevers or chills Eye:  no recent significant change in vision Ear/Nose/Mouth/Throat:  Ears:  no hearing loss Nose/Mouth/Throat:  no complaints of nasal congestion, no sore throat Cardiovascular:  no chest pain Respiratory:  no shortness of breath Gastrointestinal:  no abdominal pain, no change in bowel habits GU:  Male: negative for dysuria Musculoskeletal/Extremities: +intermittent R foot pain; no pain of the joints Integumentary (Skin/Breast):  no abnormal skin lesions reported Neurologic:  no headaches Endocrine: No unexpected weight changes Hematologic/Lymphatic:  no night sweats  Exam BP 120/71 (BP Location: Left Arm, Patient Position: Sitting, Cuff Size: Normal)   Pulse 61   Temp 97.8 F (36.6 C)   Ht 6' (1.829 m)   Wt 173 lb 8 oz (78.7 kg)   SpO2 98%    BMI 23.53 kg/m  General:  well developed, well nourished, in no apparent distress Skin:  no significant moles, warts, or growths Head:  no masses, lesions, or tenderness Eyes:  pupils equal and round, sclera anicteric without injection Ears:  canals without lesions, TMs shiny without retraction, no obvious effusion, no erythema Nose:  nares patent, mucosa normal Throat/Pharynx:  lips and gingiva without lesion; tongue and uvula midline; non-inflamed pharynx; no exudates or postnasal drainage Neck: neck supple without adenopathy, thyromegaly, or masses Lungs:  clear to auscultation, breath sounds equal bilaterally, no respiratory distress Cardio:  regular rate and rhythm, no bruits, no LE edema Abdomen:  abdomen soft, nontender; bowel sounds normal; no masses or organomegaly Genital (male): Deferred Rectal: Deferred Musculoskeletal:  symmetrical muscle groups noted without atrophy or deformity Extremities:  no clubbing, cyanosis, or edema, no deformities, no skin discoloration Neuro:  gait normal; deep tendon reflexes normal and symmetric Psych: well oriented with normal range of affect and appropriate judgment/insight  Assessment and Plan  Well adult exam - Plan: Lipid panel, Comprehensive metabolic panel, CBC  Screening for HIV without presence of risk factors - Plan: HIV Antibody (routine testing w rflx)  Encounter for hepatitis C screening test for low risk patient - Plan: Hepatitis C antibody  Fatigue, unspecified type - Plan: Testosterone, TSH  GAD (generalized anxiety disorder) - Plan: citalopram (CELEXA) 20 MG tablet   Well 22  y.o. male. Counseled on diet and exercise. Self testicular exams recommended at least monthly.  Advanced directive form provided today.  Tdap today. Other orders as above. Follow up in 6 mo pending the above workup. The patient voiced understanding and agreement to the plan.  [place], DO 07/28/22 8:35 AM

## 2022-07-29 LAB — HEPATITIS C ANTIBODY: Hepatitis C Ab: NONREACTIVE

## 2022-07-29 LAB — HIV ANTIBODY (ROUTINE TESTING W REFLEX): HIV 1&2 Ab, 4th Generation: NONREACTIVE

## 2022-08-03 DIAGNOSIS — F4322 Adjustment disorder with anxiety: Secondary | ICD-10-CM | POA: Diagnosis not present

## 2022-08-10 DIAGNOSIS — M9904 Segmental and somatic dysfunction of sacral region: Secondary | ICD-10-CM | POA: Diagnosis not present

## 2022-08-10 DIAGNOSIS — M9903 Segmental and somatic dysfunction of lumbar region: Secondary | ICD-10-CM | POA: Diagnosis not present

## 2022-08-10 DIAGNOSIS — M7918 Myalgia, other site: Secondary | ICD-10-CM | POA: Diagnosis not present

## 2022-08-10 DIAGNOSIS — M9905 Segmental and somatic dysfunction of pelvic region: Secondary | ICD-10-CM | POA: Diagnosis not present

## 2022-08-24 DIAGNOSIS — F4322 Adjustment disorder with anxiety: Secondary | ICD-10-CM | POA: Diagnosis not present

## 2022-09-03 ENCOUNTER — Encounter: Payer: Self-pay | Admitting: Podiatry

## 2022-09-03 ENCOUNTER — Ambulatory Visit: Payer: BC Managed Care – PPO | Admitting: Podiatry

## 2022-09-03 DIAGNOSIS — M778 Other enthesopathies, not elsewhere classified: Secondary | ICD-10-CM

## 2022-09-03 MED ORDER — TRIAMCINOLONE ACETONIDE 40 MG/ML IJ SUSP
20.0000 mg | Freq: Once | INTRAMUSCULAR | Status: AC
  Administered 2022-09-03: 20 mg

## 2022-09-03 NOTE — Progress Notes (Signed)
Nathaniel Good presents today for follow-up of his MRI states that is really unchanged.  He wears compression sock to work he states that that makes it feel some better.  Objective: Vital signs stable alert oriented x 3.  He still has tenderness on palpation of the extensor tendons particularly the extensor digitorum longus on the lateral aspect.  Minimal tenderness on palpation medial aspect of the tendon group.  MRI does demonstrate a extensor digitorum tendinitis.  Assessment: Extensor digitorum tendinitis right.  Plan: I injected the tendon sheath today with 10 mg of Kenalog and local anesthetic.  He tolerated the procedure well. Follow-up with him in about 6 weeks

## 2022-09-07 DIAGNOSIS — M7918 Myalgia, other site: Secondary | ICD-10-CM | POA: Diagnosis not present

## 2022-09-07 DIAGNOSIS — M9905 Segmental and somatic dysfunction of pelvic region: Secondary | ICD-10-CM | POA: Diagnosis not present

## 2022-09-07 DIAGNOSIS — M9903 Segmental and somatic dysfunction of lumbar region: Secondary | ICD-10-CM | POA: Diagnosis not present

## 2022-09-07 DIAGNOSIS — M9904 Segmental and somatic dysfunction of sacral region: Secondary | ICD-10-CM | POA: Diagnosis not present

## 2022-09-14 DIAGNOSIS — F4322 Adjustment disorder with anxiety: Secondary | ICD-10-CM | POA: Diagnosis not present

## 2022-10-05 DIAGNOSIS — M9905 Segmental and somatic dysfunction of pelvic region: Secondary | ICD-10-CM | POA: Diagnosis not present

## 2022-10-05 DIAGNOSIS — M9903 Segmental and somatic dysfunction of lumbar region: Secondary | ICD-10-CM | POA: Diagnosis not present

## 2022-10-05 DIAGNOSIS — M7918 Myalgia, other site: Secondary | ICD-10-CM | POA: Diagnosis not present

## 2022-10-05 DIAGNOSIS — M9904 Segmental and somatic dysfunction of sacral region: Secondary | ICD-10-CM | POA: Diagnosis not present

## 2022-11-02 DIAGNOSIS — M9903 Segmental and somatic dysfunction of lumbar region: Secondary | ICD-10-CM | POA: Diagnosis not present

## 2022-11-02 DIAGNOSIS — M9904 Segmental and somatic dysfunction of sacral region: Secondary | ICD-10-CM | POA: Diagnosis not present

## 2022-11-02 DIAGNOSIS — M9905 Segmental and somatic dysfunction of pelvic region: Secondary | ICD-10-CM | POA: Diagnosis not present

## 2022-11-02 DIAGNOSIS — M7918 Myalgia, other site: Secondary | ICD-10-CM | POA: Diagnosis not present

## 2022-11-03 ENCOUNTER — Ambulatory Visit: Payer: BC Managed Care – PPO | Admitting: Podiatry

## 2022-11-12 ENCOUNTER — Ambulatory Visit: Payer: BC Managed Care – PPO | Admitting: Podiatry

## 2022-11-12 DIAGNOSIS — M778 Other enthesopathies, not elsewhere classified: Secondary | ICD-10-CM | POA: Diagnosis not present

## 2022-11-12 DIAGNOSIS — D2371 Other benign neoplasm of skin of right lower limb, including hip: Secondary | ICD-10-CM

## 2022-11-12 MED ORDER — METHYLPREDNISOLONE 4 MG PO TBPK: ORAL_TABLET | ORAL | 0 refills | Status: DC

## 2022-11-13 NOTE — Progress Notes (Signed)
He presents today for follow-up of his pain to the dorsal aspect of his right foot.  States that is still the same it was doing well up a while now that started running and is come back.  He states the injections helped he is back down to about 50%.  Objective: Vital signs are stable he is alert oriented x 3 still has pain on palpation to the tensor tendons overlying the midfoot.  He had extensor tendinitis on MRI.  Benign skin lesion lateral aspect fourth toe right foot  Assessment: Spencer tendinitis.  Possibly compensating for the painful lesion lateral aspect of the fourth toe  Plan: Discussed appropriate shoe gear and when to send him to physical therapy I also trimmed a painful callus to the fourth toe of the right foot.  He did not go to physical therapy.

## 2022-11-17 DIAGNOSIS — M25671 Stiffness of right ankle, not elsewhere classified: Secondary | ICD-10-CM | POA: Diagnosis not present

## 2022-11-17 DIAGNOSIS — M799 Soft tissue disorder, unspecified: Secondary | ICD-10-CM | POA: Diagnosis not present

## 2022-11-17 DIAGNOSIS — M25571 Pain in right ankle and joints of right foot: Secondary | ICD-10-CM | POA: Diagnosis not present

## 2022-11-19 DIAGNOSIS — M25571 Pain in right ankle and joints of right foot: Secondary | ICD-10-CM | POA: Diagnosis not present

## 2022-11-19 DIAGNOSIS — M799 Soft tissue disorder, unspecified: Secondary | ICD-10-CM | POA: Diagnosis not present

## 2022-11-19 DIAGNOSIS — M25671 Stiffness of right ankle, not elsewhere classified: Secondary | ICD-10-CM | POA: Diagnosis not present

## 2022-11-24 DIAGNOSIS — M25571 Pain in right ankle and joints of right foot: Secondary | ICD-10-CM | POA: Diagnosis not present

## 2022-11-24 DIAGNOSIS — M25671 Stiffness of right ankle, not elsewhere classified: Secondary | ICD-10-CM | POA: Diagnosis not present

## 2022-11-24 DIAGNOSIS — M799 Soft tissue disorder, unspecified: Secondary | ICD-10-CM | POA: Diagnosis not present

## 2022-11-26 DIAGNOSIS — M799 Soft tissue disorder, unspecified: Secondary | ICD-10-CM | POA: Diagnosis not present

## 2022-11-26 DIAGNOSIS — M25671 Stiffness of right ankle, not elsewhere classified: Secondary | ICD-10-CM | POA: Diagnosis not present

## 2022-11-26 DIAGNOSIS — M25571 Pain in right ankle and joints of right foot: Secondary | ICD-10-CM | POA: Diagnosis not present

## 2022-12-01 DIAGNOSIS — M25671 Stiffness of right ankle, not elsewhere classified: Secondary | ICD-10-CM | POA: Diagnosis not present

## 2022-12-01 DIAGNOSIS — M25571 Pain in right ankle and joints of right foot: Secondary | ICD-10-CM | POA: Diagnosis not present

## 2022-12-01 DIAGNOSIS — M799 Soft tissue disorder, unspecified: Secondary | ICD-10-CM | POA: Diagnosis not present

## 2022-12-03 DIAGNOSIS — M25671 Stiffness of right ankle, not elsewhere classified: Secondary | ICD-10-CM | POA: Diagnosis not present

## 2022-12-03 DIAGNOSIS — M25571 Pain in right ankle and joints of right foot: Secondary | ICD-10-CM | POA: Diagnosis not present

## 2022-12-03 DIAGNOSIS — M799 Soft tissue disorder, unspecified: Secondary | ICD-10-CM | POA: Diagnosis not present

## 2023-01-26 DIAGNOSIS — F4322 Adjustment disorder with anxiety: Secondary | ICD-10-CM | POA: Diagnosis not present

## 2023-06-09 DIAGNOSIS — F4322 Adjustment disorder with anxiety: Secondary | ICD-10-CM | POA: Diagnosis not present

## 2023-07-22 ENCOUNTER — Other Ambulatory Visit: Payer: Self-pay | Admitting: Family Medicine

## 2023-07-22 DIAGNOSIS — F411 Generalized anxiety disorder: Secondary | ICD-10-CM

## 2023-08-25 ENCOUNTER — Other Ambulatory Visit: Payer: Self-pay | Admitting: Family Medicine

## 2023-08-25 DIAGNOSIS — F411 Generalized anxiety disorder: Secondary | ICD-10-CM

## 2023-12-07 DIAGNOSIS — F4322 Adjustment disorder with anxiety: Secondary | ICD-10-CM | POA: Diagnosis not present

## 2024-03-20 ENCOUNTER — Ambulatory Visit: Payer: Self-pay | Admitting: *Deleted

## 2024-03-20 NOTE — Telephone Encounter (Signed)
 FYI Only or Action Required?: FYI only for provider.  Nathaniel was last seen in primary care on 07/28/2022 by Frann Mabel Mt, DO.  Called Nurse Triage reporting Anxiety.  Symptoms began several days ago.  Interventions attempted: Rest, hydration, or home remedies.  Symptoms are: gradually worsening.  Triage Disposition: See PCP When Office is Open (Within 3 Days)  Nathaniel/caregiver understands and will follow disposition?: Yes                 Copied from CRM #[id number]. Topic: Clinical - Red Word Triage >> Mar 20, 2024  8:18 AM Nathaniel Good wrote: [facility] that prompted transfer to Nurse Triage: Patients mom Nathaniel Good states Nathaniel needs to see Dr Frann again in regards to his mental health and medication. Reason for Disposition  MODERATE anxiety (e.g., persistent or frequent anxiety symptoms; interferes with sleep, school, or work)  Answer Assessment - Initial Assessment Questions Appt tomorrow . Recommended if sx worsen call back or go to ED. Gave information for urgent crisis center      1. CONCERN: Did anything happen that prompted you to call today?      Stopped medications, irritable stressed lost job breakup with girlfriend Saturday per Nathaniel Good  2. ANXIETY SYMPTOMS: Can you describe how you (your loved one; Nathaniel) have been feeling? (e.g., tense, restless, panicky, anxious, keyed up, overwhelmed, sense of impending doom).      Feels lonely , stressed. 3. ONSET: How long have you been feeling this way? (e.g., hours, days, weeks)     Worsening since Saturday  4. SEVERITY: How would you rate the level of anxiety? (e.g., 0 - 10; or mild, moderate, severe).     moderate 5. FUNCTIONAL IMPAIRMENT: How have these feelings affected your ability to do daily activities? Have you had more difficulty than usual doing your normal daily activities? (e.g., getting better, same, worse; self-care, school, work, interactions)     Able to work out but  has stopped running as normal. 6. HISTORY: Have you felt this way before? Have you ever been diagnosed with an anxiety problem in the past? (e.g., generalized anxiety disorder, panic attacks, PTSD). If Yes, ask: How was this problem treated? (e.g., medicines, counseling, etc.)     Stopped medications for several months.  7. RISK OF HARM - SUICIDAL IDEATION: Do you ever have thoughts of hurting or killing yourself? If Yes, ask:  Do you have these feelings now? Do you have a plan on how you would do this?     No  8. TREATMENT:  What has been done so far to treat this anxiety? (e.g., medicines, relaxation strategies). What has helped?     Na  9. THERAPIST: Do you have a counselor or therapist? If Yes, ask: What is their name?     Yes  10. POTENTIAL TRIGGERS: Do you drink caffeinated beverages (e.g., coffee, colas, teas), and how much daily? Do you drink alcohol or use any drugs? Have you started any new medicines recently?       na 11. Nathaniel SUPPORT: Who is with you now? Who do you live with? Do you have family or friends who you can talk to?        Family  12. OTHER SYMPTOMS: Do you have any other symptoms? (e.g., feeling depressed, trouble concentrating, trouble sleeping, trouble breathing, palpitations or fast heartbeat, chest pain, sweating, nausea, or diarrhea)       Trouble sleeping , irritable at times.   13. PREGNANCY: Is there  any chance you are pregnant? When was your last menstrual period?       na  Protocols used: Anxiety and Panic Attack-A-AH

## 2024-03-21 ENCOUNTER — Ambulatory Visit (INDEPENDENT_AMBULATORY_CARE_PROVIDER_SITE_OTHER): Payer: Self-pay | Admitting: Family Medicine

## 2024-03-21 ENCOUNTER — Encounter: Payer: Self-pay | Admitting: Family Medicine

## 2024-03-21 VITALS — BP 122/74 | HR 93 | Temp 98.0°F | Resp 16 | Ht 73.0 in | Wt 185.0 lb

## 2024-03-21 DIAGNOSIS — F339 Major depressive disorder, recurrent, unspecified: Secondary | ICD-10-CM

## 2024-03-21 DIAGNOSIS — R41840 Attention and concentration deficit: Secondary | ICD-10-CM

## 2024-03-21 DIAGNOSIS — F411 Generalized anxiety disorder: Secondary | ICD-10-CM

## 2024-03-21 MED ORDER — CITALOPRAM HYDROBROMIDE 20 MG PO TABS: ORAL_TABLET | ORAL | 0 refills | Status: DC

## 2024-03-21 MED ORDER — CITALOPRAM HYDROBROMIDE 20 MG PO TABS: 20.0000 mg | ORAL_TABLET | Freq: Every day | ORAL | 2 refills | Status: DC

## 2024-03-21 NOTE — Patient Instructions (Addendum)
Aim to do some physical exertion for 150 minutes per week. This is typically divided into 5 days per week, 30 minutes per day. The activity should be enough to get your heart rate up. Anything is better than nothing if you have time constraints.  If you do not hear anything about your referral in the next 1-2 weeks, call our office and ask for an update.  Let us know if you need anything.

## 2024-03-21 NOTE — Progress Notes (Signed)
 Chief Complaint  Patient presents with   Anxiety    Anxiety Medication    Subjective Nathaniel Good presents for f/u anxiety, depression. Here w mom.  Pt is currently being treated with nothing.  He came off of Celexa  7 months ago.  Having anger, some anxiety but no panic, depression, lack of interest in doing things, decreased concentration.  Reports struggling since going off of treatment from April. No thoughts of harming self or others. No self-medication with alcohol, prescription drugs or illicit drugs. Pt is following with a counselor/psychologist.  Past Medical History:  Diagnosis Date   No known health problems    Allergies as of 03/21/2024   No Known Allergies      Medication List        Accurate as of March 21, 2024 12:20 PM. If you have any questions, ask your nurse or doctor.          STOP taking these medications    hydrOXYzine  10 MG tablet Commonly known as: ATARAX  Stopped by: Mabel Deward Pry   methylPREDNISolone  4 MG Tbpk tablet Commonly known as: MEDROL  DOSEPAK Stopped by: Mabel Deward Darline Faith       TAKE these medications    citalopram  20 MG tablet Commonly known as: CELEXA  Take 1/2 tab daily for 2 weeks and then 1 tab daily. What changed:  how much to take how to take this when to take this additional instructions Changed by: Mabel Deward Pry   citalopram  20 MG tablet Commonly known as: CELEXA  Take 1 tablet (20 mg total) by mouth daily. Start taking on: April 20, 2024 What changed: You were already taking a medication with the same name, and this prescription was added. Make sure you understand how and when to take each. Changed by: Mabel Deward Pry        Exam BP 122/74 (BP Location: Left Arm, Patient Position: Sitting)   Pulse 93   Temp 98 F (36.7 C) (Oral)   Resp 16   Ht 6' 1 (1.854 m)   Wt 185 lb (83.9 kg)   SpO2 99%   BMI 24.41 kg/m  General:  well developed, well nourished, in no  apparent distress Lungs:  No respiratory distress Psych: well oriented with normal range of affect and age-appropriate judgement/insight, alert and oriented x4.  Assessment and Plan  GAD (generalized anxiety disorder) - Plan: citalopram  (CELEXA ) 20 MG tablet, citalopram  (CELEXA ) 20 MG tablet  Depression, recurrent (HCC)  Inattention - Plan: Ambulatory referral to Psychology  Chronic, uncontrolled. Restart Celexa  10 mg/d for 2 weeks and then back to 20 mg/d. Cont w counseling. Counseled on exercise. F/u in 6 weeks. Refer to [facility] for ADHD eval.  The patient voiced understanding and agreement to the plan.  Mabel Deward Wilson, DO 03/21/24 12:20 PM

## 2024-04-07 DIAGNOSIS — F4322 Adjustment disorder with anxiety: Secondary | ICD-10-CM | POA: Diagnosis not present

## 2024-04-11 ENCOUNTER — Encounter (HOSPITAL_COMMUNITY): Payer: Self-pay | Admitting: Emergency Medicine

## 2024-04-11 ENCOUNTER — Emergency Department (HOSPITAL_COMMUNITY)
Admission: EM | Admit: 2024-04-11 | Discharge: 2024-04-12 | Disposition: A | Discharge status: Home / Self Care | Payer: Self-pay | Attending: Emergency Medicine | Admitting: Emergency Medicine

## 2024-04-11 ENCOUNTER — Other Ambulatory Visit: Payer: Self-pay

## 2024-04-11 DIAGNOSIS — D72829 Elevated white blood cell count, unspecified: Secondary | ICD-10-CM | POA: Insufficient documentation

## 2024-04-11 DIAGNOSIS — S60416A Abrasion of right little finger, initial encounter: Secondary | ICD-10-CM | POA: Insufficient documentation

## 2024-04-11 DIAGNOSIS — Z041 Encounter for examination and observation following transport accident: Secondary | ICD-10-CM | POA: Diagnosis not present

## 2024-04-11 DIAGNOSIS — S199XXA Unspecified injury of neck, initial encounter: Secondary | ICD-10-CM | POA: Diagnosis not present

## 2024-04-11 DIAGNOSIS — S0181XA Laceration without foreign body of other part of head, initial encounter: Secondary | ICD-10-CM | POA: Insufficient documentation

## 2024-04-11 DIAGNOSIS — S2243XA Multiple fractures of ribs, bilateral, initial encounter for closed fracture: Secondary | ICD-10-CM | POA: Diagnosis not present

## 2024-04-11 DIAGNOSIS — Y92410 Unspecified street and highway as the place of occurrence of the external cause: Secondary | ICD-10-CM | POA: Diagnosis not present

## 2024-04-11 DIAGNOSIS — S2232XA Fracture of one rib, left side, initial encounter for closed fracture: Secondary | ICD-10-CM | POA: Diagnosis not present

## 2024-04-11 DIAGNOSIS — R402411 Glasgow coma scale score 13-15, in the field [EMT or ambulance]: Secondary | ICD-10-CM | POA: Diagnosis not present

## 2024-04-11 DIAGNOSIS — S0990XA Unspecified injury of head, initial encounter: Secondary | ICD-10-CM | POA: Diagnosis not present

## 2024-04-11 DIAGNOSIS — R410 Disorientation, unspecified: Secondary | ICD-10-CM | POA: Diagnosis not present

## 2024-04-11 DIAGNOSIS — S2231XA Fracture of one rib, right side, initial encounter for closed fracture: Secondary | ICD-10-CM | POA: Insufficient documentation

## 2024-04-11 DIAGNOSIS — S0081XA Abrasion of other part of head, initial encounter: Secondary | ICD-10-CM | POA: Diagnosis not present

## 2024-04-11 DIAGNOSIS — S299XXA Unspecified injury of thorax, initial encounter: Secondary | ICD-10-CM | POA: Diagnosis not present

## 2024-04-11 DIAGNOSIS — T07XXXA Unspecified multiple injuries, initial encounter: Secondary | ICD-10-CM

## 2024-04-11 DIAGNOSIS — R58 Hemorrhage, not elsewhere classified: Secondary | ICD-10-CM | POA: Diagnosis not present
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
## 2024-04-12 ENCOUNTER — Emergency Department (HOSPITAL_COMMUNITY): Payer: Self-pay

## 2024-04-12 ENCOUNTER — Emergency Department (HOSPITAL_COMMUNITY)

## 2024-04-12 ENCOUNTER — Other Ambulatory Visit: Payer: Self-pay | Admitting: Family Medicine

## 2024-04-12 DIAGNOSIS — S2243XA Multiple fractures of ribs, bilateral, initial encounter for closed fracture: Secondary | ICD-10-CM | POA: Diagnosis not present

## 2024-04-12 DIAGNOSIS — F411 Generalized anxiety disorder: Secondary | ICD-10-CM

## 2024-04-12 DIAGNOSIS — S199XXA Unspecified injury of neck, initial encounter: Secondary | ICD-10-CM | POA: Diagnosis not present

## 2024-04-12 DIAGNOSIS — Z041 Encounter for examination and observation following transport accident: Secondary | ICD-10-CM | POA: Diagnosis not present

## 2024-04-12 LAB — CBC WITH DIFFERENTIAL/PLATELET
Abs Immature Granulocytes: 0.05 K/uL (ref 0.00–0.07)
Basophils Absolute: 0 K/uL (ref 0.0–0.1)
Basophils Relative: 0 %
Eosinophils Absolute: 0 K/uL (ref 0.0–0.5)
Eosinophils Relative: 0 %
HCT: 45.7 % (ref 39.0–52.0)
Hemoglobin: 16 g/dL (ref 13.0–17.0)
Immature Granulocytes: 0 %
Lymphocytes Relative: 10 %
Lymphs Abs: 1.1 K/uL (ref 0.7–4.0)
MCH: 30.6 pg (ref 26.0–34.0)
MCHC: 35 g/dL (ref 30.0–36.0)
MCV: 87.4 fL (ref 80.0–100.0)
Monocytes Absolute: 0.7 K/uL (ref 0.1–1.0)
Monocytes Relative: 6 %
Neutro Abs: 9.5 K/uL — ABNORMAL HIGH (ref 1.7–7.7)
Neutrophils Relative %: 84 %
Platelets: 302 K/uL (ref 150–400)
RBC: 5.23 MIL/uL (ref 4.22–5.81)
RDW: 12.3 % (ref 11.5–15.5)
WBC: 11.4 K/uL — ABNORMAL HIGH (ref 4.0–10.5)
nRBC: 0 % (ref 0.0–0.2)

## 2024-04-12 LAB — COMPREHENSIVE METABOLIC PANEL WITH GFR
ALT: 18 U/L (ref 0–44)
AST: 31 U/L (ref 15–41)
Albumin: 4 g/dL (ref 3.5–5.0)
Alkaline Phosphatase: 63 U/L (ref 38–126)
Anion gap: 12 (ref 5–15)
BUN: 9 mg/dL (ref 6–20)
CO2: 20 mmol/L — ABNORMAL LOW (ref 22–32)
Calcium: 8.9 mg/dL (ref 8.9–10.3)
Chloride: 108 mmol/L (ref 98–111)
Creatinine, Ser: 1.08 mg/dL (ref 0.61–1.24)
GFR, Estimated: >60 mL/min (ref >60)
Glucose, Bld: 109 mg/dL — ABNORMAL HIGH (ref 70–99)
Potassium: 3.8 mmol/L (ref 3.5–5.1)
Sodium: 140 mmol/L (ref 135–145)
Total Bilirubin: 0.5 mg/dL (ref 0.0–1.2)
Total Protein: 7.2 g/dL (ref 6.5–8.1)

## 2024-04-12 LAB — I-STAT CHEM 8, ED
BUN: 9 mg/dL (ref 6–20)
Calcium, Ion: 1.11 mmol/L — ABNORMAL LOW (ref 1.15–1.40)
Chloride: 105 mmol/L (ref 98–111)
Creatinine, Ser: 1.3 mg/dL — ABNORMAL HIGH (ref 0.61–1.24)
Glucose, Bld: 110 mg/dL — ABNORMAL HIGH (ref 70–99)
HCT: 46 % (ref 39.0–52.0)
Hemoglobin: 15.6 g/dL (ref 13.0–17.0)
Potassium: 3.8 mmol/L (ref 3.5–5.1)
Sodium: 143 mmol/L (ref 135–145)
TCO2: 24 mmol/L (ref 22–32)

## 2024-04-12 LAB — LIPASE, BLOOD: Lipase: 49 U/L (ref 11–51)
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
## 2024-04-12 NOTE — Progress Notes (Signed)
 Chaplain checked in on pt and parents. Chaplain offered ministry of hospitality to parents who denied needing anything.  Pt was talkative and lively during the brief visit.SABRA Rock Orange Chaplain
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
## 2024-04-14 ENCOUNTER — Ambulatory Visit: Payer: Self-pay

## 2024-04-14 NOTE — Telephone Encounter (Signed)
 Mother states they would like a call back at [phone] (Chris/father's phone #)
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
## 2024-04-15 ENCOUNTER — Ambulatory Visit (HOSPITAL_COMMUNITY): Admission: EM | Admit: 2024-04-15 | Discharge: 2024-04-15 | Discharge status: Against Medical Advice

## 2024-04-15 NOTE — Telephone Encounter (Signed)
 Not much to do in the meanwhile. If the Celexa  is helping, stick with it until we see each other next. Will probably need to adjust some things.
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
## 2024-04-19 DIAGNOSIS — F4322 Adjustment disorder with anxiety: Secondary | ICD-10-CM | POA: Diagnosis not present

## 2024-04-20 ENCOUNTER — Ambulatory Visit: Payer: Self-pay
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
## 2024-04-20 NOTE — Telephone Encounter (Signed)
 Appt scheduled

## 2024-04-24 ENCOUNTER — Ambulatory Visit: Admitting: Family Medicine

## 2024-04-26 ENCOUNTER — Encounter: Payer: Self-pay | Admitting: Family Medicine

## 2024-04-26 ENCOUNTER — Ambulatory Visit: Admitting: Family Medicine

## 2024-04-26 VITALS — BP 110/78 | HR 89 | Temp 98.0°F | Resp 16 | Ht 73.0 in | Wt 185.2 lb

## 2024-04-26 DIAGNOSIS — F411 Generalized anxiety disorder: Secondary | ICD-10-CM

## 2024-04-26 NOTE — Progress Notes (Signed)
 Chief Complaint  Patient presents with   Anxiety    Anxiety     Subjective Harden LITTIE Devonshire presents for f/u anxiety.  Pt is currently being treated with Celexa  20 mg/d.  Reports doing well since treatment. Around 25% better.  No thoughts of harming self or others. No self-medication with alcohol, prescription drugs or illicit drugs. Pt is following with a counselor/psychologist.  Past Medical History:  Diagnosis Date   No known health problems    Exam BP 110/78 (BP Location: Left Arm, Patient Position: Sitting)   Pulse 89   Temp 98 F (36.7 C) (Oral)   Resp 16   Ht 6' 1 (1.854 m)   Wt 185 lb 3.2 oz (84 kg)   SpO2 98%   BMI 24.43 kg/m  General:  well developed, well nourished, in no apparent distress Lungs:  No respiratory distress Psych: well oriented with normal range of affect and age-appropriate judgement/insight, alert and oriented x4.  Assessment and Plan  GAD (generalized anxiety disorder)  Chronic, stable. Cont Celexa  20 mg/d. Cont w counseling. F/u in 6 mo.  The patient voiced understanding and agreement to the plan.  Mabel Mt [place], DO 04/26/24 2:03 PM

## 2024-04-26 NOTE — Patient Instructions (Signed)
 Aim to do some physical exertion for 150 minutes per week. This is typically divided into 5 days per week, 30 minutes per day. The activity should be enough to get your heart rate up. Anything is better than nothing if you have time constraints.  Let us know if you need anything.

## 2024-05-02 ENCOUNTER — Ambulatory Visit: Payer: Self-pay | Admitting: Family Medicine

## 2024-05-03 DIAGNOSIS — F4322 Adjustment disorder with anxiety: Secondary | ICD-10-CM | POA: Diagnosis not present

## 2024-05-12 DIAGNOSIS — F4322 Adjustment disorder with anxiety: Secondary | ICD-10-CM | POA: Diagnosis not present

## 2024-05-31 DIAGNOSIS — F4322 Adjustment disorder with anxiety: Secondary | ICD-10-CM | POA: Diagnosis not present

## 2024-10-25 ENCOUNTER — Encounter: Admitting: Family Medicine
